# Patient Record
Sex: Female | Born: 1960 | Race: White | Hispanic: No | Marital: Married
Health system: Southern US, Community
[De-identification: ages and names within clinical notes are randomized; demographics above are authoritative.]

---

## 2008-02-20 ENCOUNTER — Encounter: Admission: RE | Admit: 2008-02-20 | Discharge: 2008-02-20 | Payer: Self-pay | Admitting: Internal Medicine

## 2008-12-27 ENCOUNTER — Emergency Department (HOSPITAL_COMMUNITY): Admission: EM | Admit: 2008-12-27 | Discharge: 2008-12-27 | Payer: Self-pay | Admitting: Emergency Medicine

## 2009-02-23 ENCOUNTER — Encounter: Admission: RE | Admit: 2009-02-23 | Discharge: 2009-02-23 | Payer: Self-pay | Admitting: Internal Medicine

## 2010-02-25 ENCOUNTER — Encounter
Admission: RE | Admit: 2010-02-25 | Discharge: 2010-02-25 | Payer: Self-pay | Source: Home / Self Care | Attending: Internal Medicine | Admitting: Internal Medicine

## 2010-03-31 ENCOUNTER — Encounter
Admission: RE | Admit: 2010-03-31 | Discharge: 2010-03-31 | Payer: Self-pay | Source: Home / Self Care | Attending: Internal Medicine | Admitting: Internal Medicine

## 2010-12-12 ENCOUNTER — Other Ambulatory Visit: Payer: Self-pay | Admitting: Internal Medicine

## 2010-12-12 DIAGNOSIS — R102 Pelvic and perineal pain: Secondary | ICD-10-CM

## 2010-12-21 ENCOUNTER — Ambulatory Visit
Admission: RE | Admit: 2010-12-21 | Discharge: 2010-12-21 | Disposition: A | Discharge status: Home / Self Care | Payer: BC Managed Care – PPO | Source: Ambulatory Visit | Attending: Internal Medicine | Admitting: Internal Medicine

## 2010-12-21 DIAGNOSIS — R102 Pelvic and perineal pain: Secondary | ICD-10-CM

## 2011-03-28 ENCOUNTER — Other Ambulatory Visit: Payer: Self-pay | Admitting: Internal Medicine

## 2011-03-28 DIAGNOSIS — Z1231 Encounter for screening mammogram for malignant neoplasm of breast: Secondary | ICD-10-CM

## 2011-04-25 ENCOUNTER — Ambulatory Visit
Admission: RE | Admit: 2011-04-25 | Discharge: 2011-04-25 | Disposition: A | Discharge status: Home / Self Care | Payer: BC Managed Care – PPO | Source: Ambulatory Visit | Attending: Internal Medicine | Admitting: Internal Medicine

## 2011-04-25 DIAGNOSIS — Z1231 Encounter for screening mammogram for malignant neoplasm of breast: Secondary | ICD-10-CM

## 2012-06-05 ENCOUNTER — Other Ambulatory Visit: Payer: Self-pay

## 2012-06-05 DIAGNOSIS — Z1231 Encounter for screening mammogram for malignant neoplasm of breast: Secondary | ICD-10-CM

## 2012-07-16 ENCOUNTER — Ambulatory Visit
Admission: RE | Admit: 2012-07-16 | Discharge: 2012-07-16 | Disposition: A | Discharge status: Home / Self Care | Payer: Self-pay | Source: Ambulatory Visit

## 2012-07-16 DIAGNOSIS — Z1231 Encounter for screening mammogram for malignant neoplasm of breast: Secondary | ICD-10-CM

## 2012-07-17 ENCOUNTER — Other Ambulatory Visit (HOSPITAL_COMMUNITY): Payer: Self-pay | Admitting: Diagnostic Radiology

## 2012-07-17 DIAGNOSIS — R928 Other abnormal and inconclusive findings on diagnostic imaging of breast: Secondary | ICD-10-CM

## 2012-07-24 ENCOUNTER — Other Ambulatory Visit: Payer: Self-pay | Admitting: Internal Medicine

## 2012-07-24 DIAGNOSIS — R928 Other abnormal and inconclusive findings on diagnostic imaging of breast: Secondary | ICD-10-CM

## 2012-07-29 ENCOUNTER — Ambulatory Visit
Admission: RE | Admit: 2012-07-29 | Discharge: 2012-07-29 | Disposition: A | Discharge status: Home / Self Care | Payer: BC Managed Care – PPO | Source: Ambulatory Visit | Attending: Diagnostic Radiology | Admitting: Diagnostic Radiology

## 2012-07-29 DIAGNOSIS — R928 Other abnormal and inconclusive findings on diagnostic imaging of breast: Secondary | ICD-10-CM

## 2013-02-26 IMAGING — US US PELVIS COMPLETE
1 series · 14 of 25 positions shown · non-contrast
Comparison: None.

CLINICAL DATA: Abdominal pain, right lower quadrant pain

TRANSABDOMINAL ULTRASOUND OF PELVIS
TECHNIQUE: Transabdominal ultrasound examination of the pelvis was
performed including evaluation of the uterus, ovaries, adnexal
regions, and pelvic cul-de-sac.

[Series 1: us pelvis complete · 0.22mm/px · 14 of 66 slices shown]
[im 1/66]
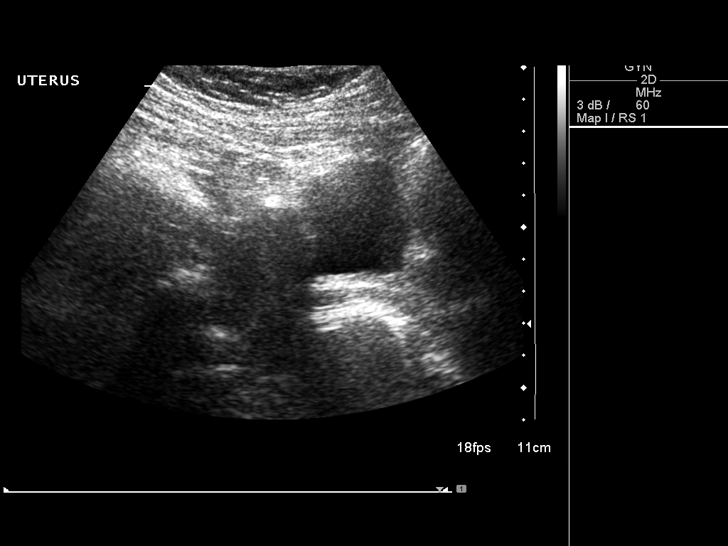
[im 6/66]
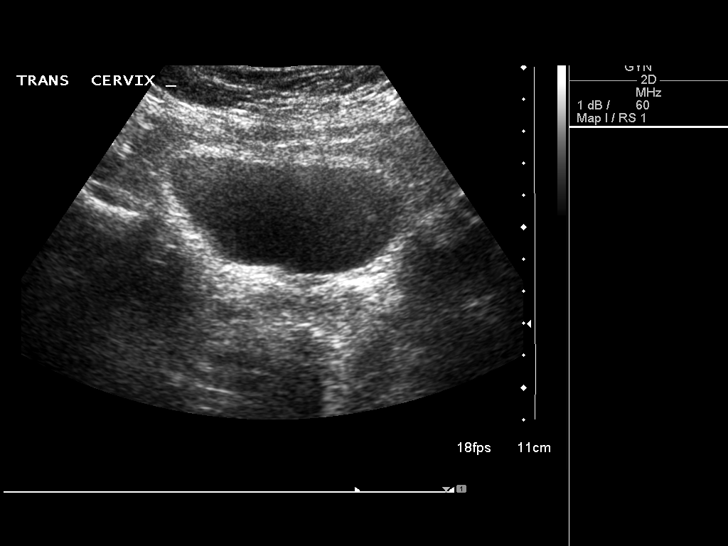
[im 11/66]
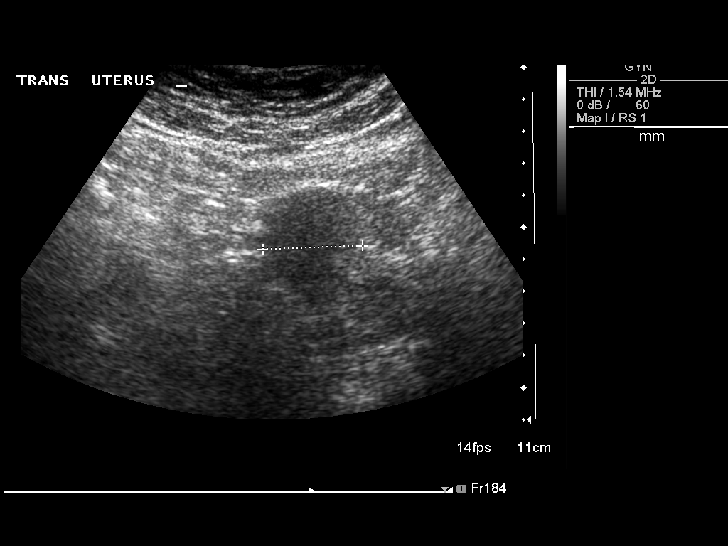
[im 17/66]
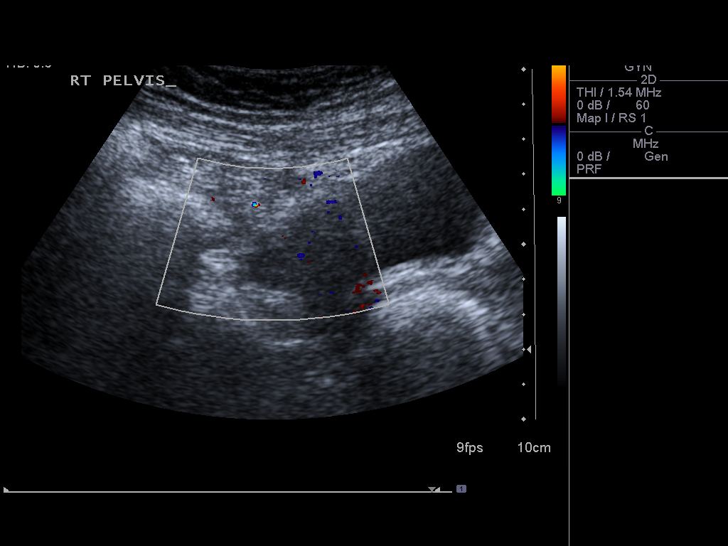
[im 22/66]
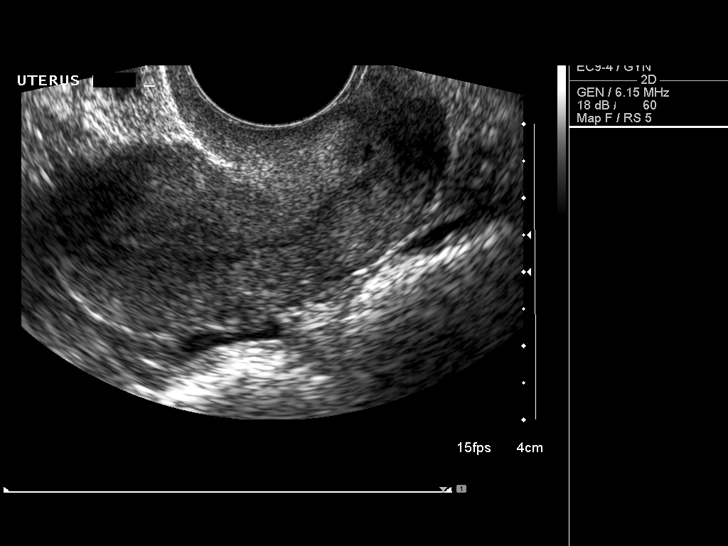
[im 25/66]
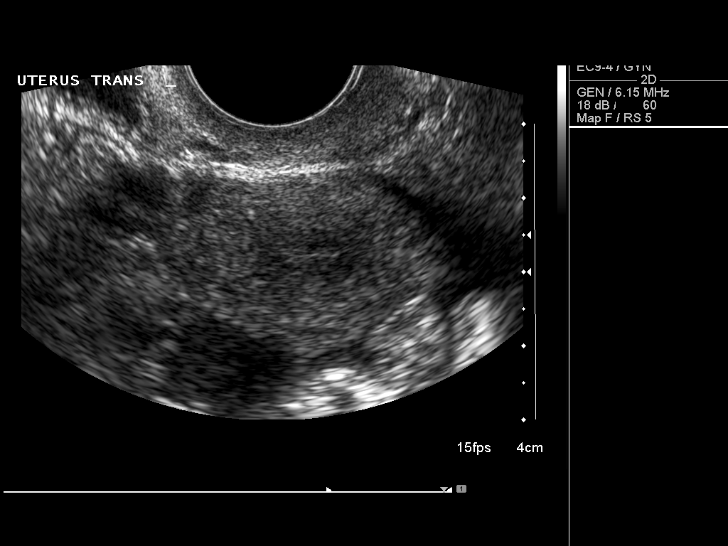
[im 30/66]
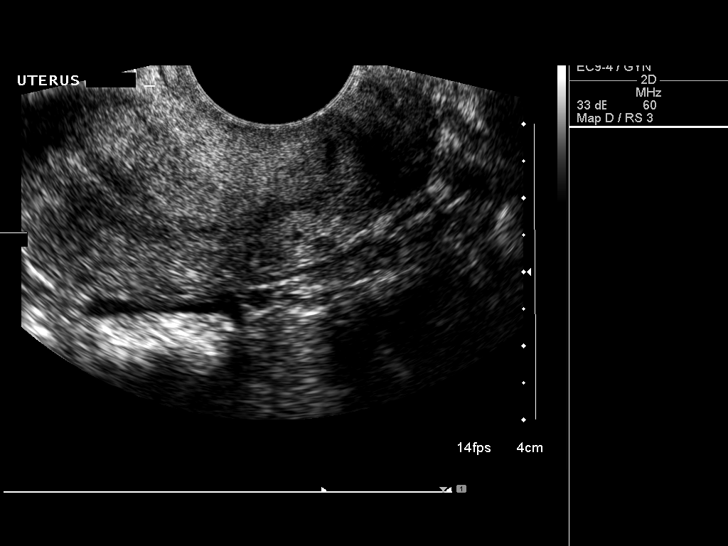
[im 36/66]
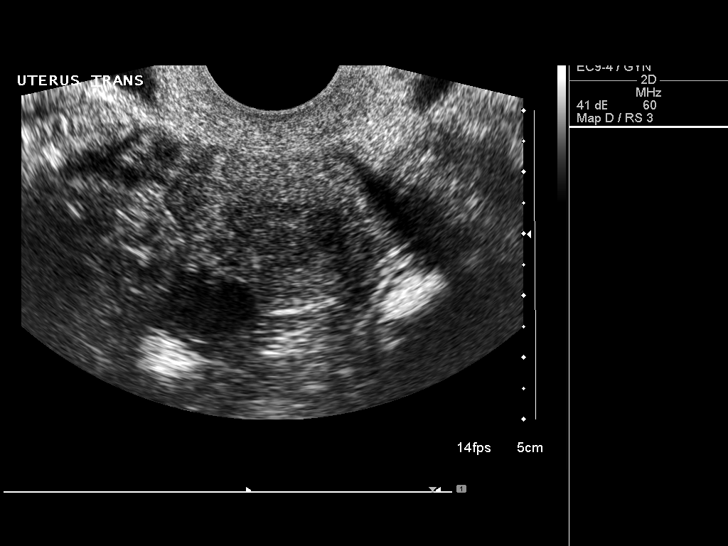
[im 41/66]
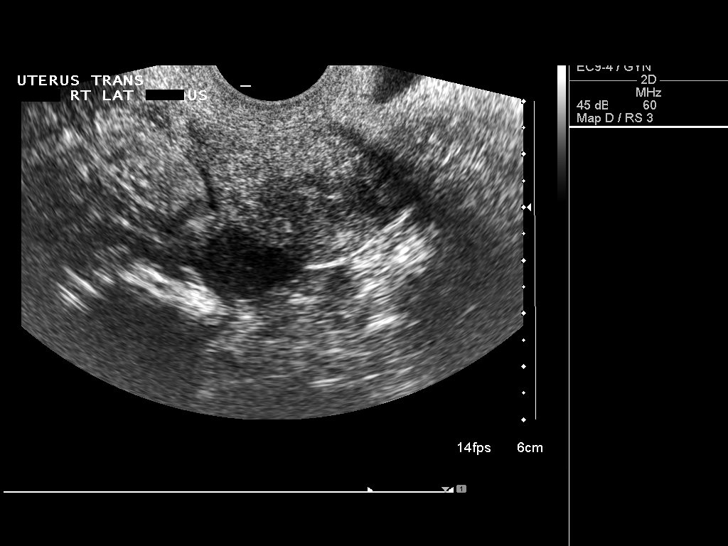
[im 44/66]
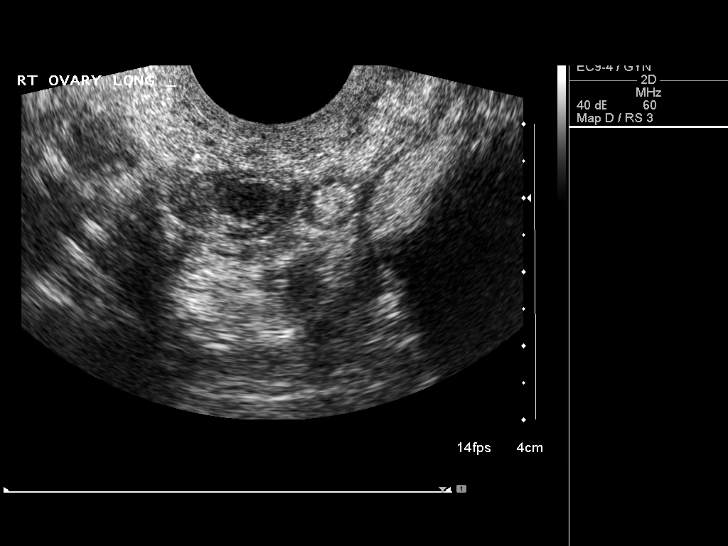
[im 49/66]
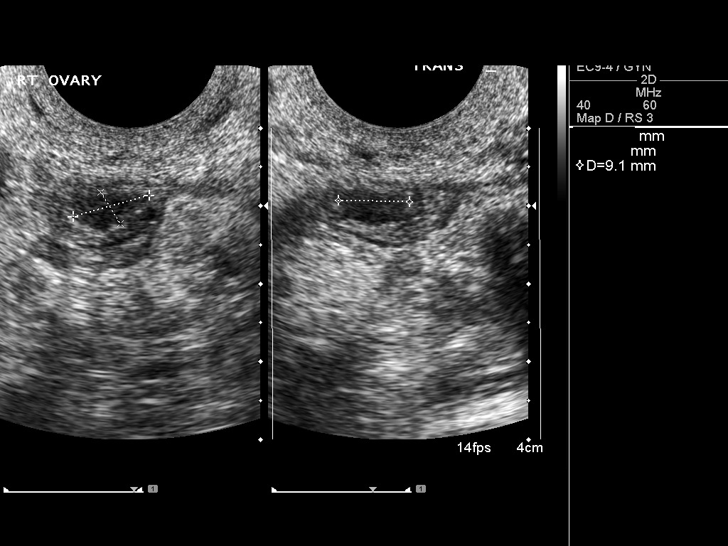
[im 55/66]
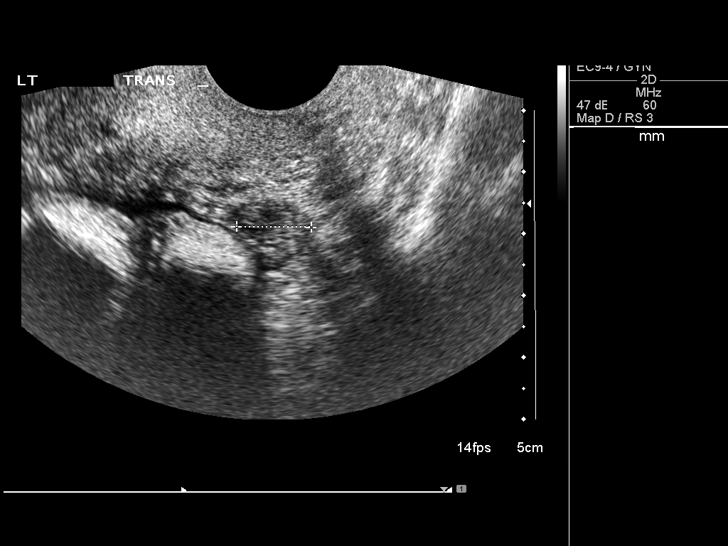
[im 60/66]
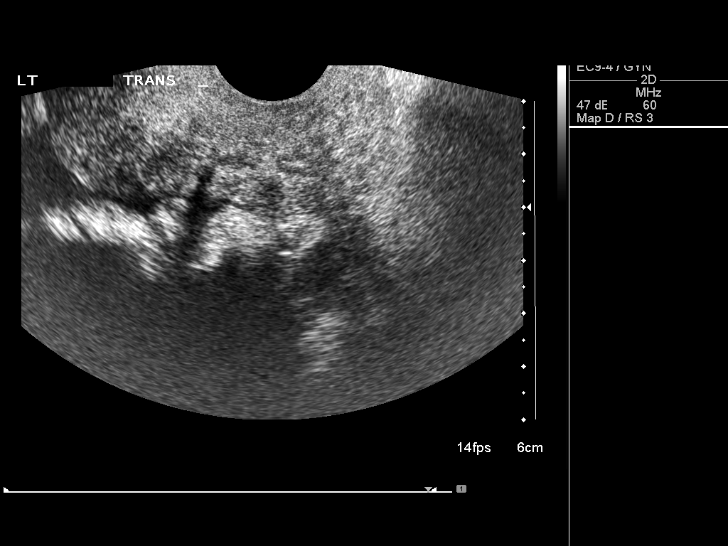
[im 66/66]
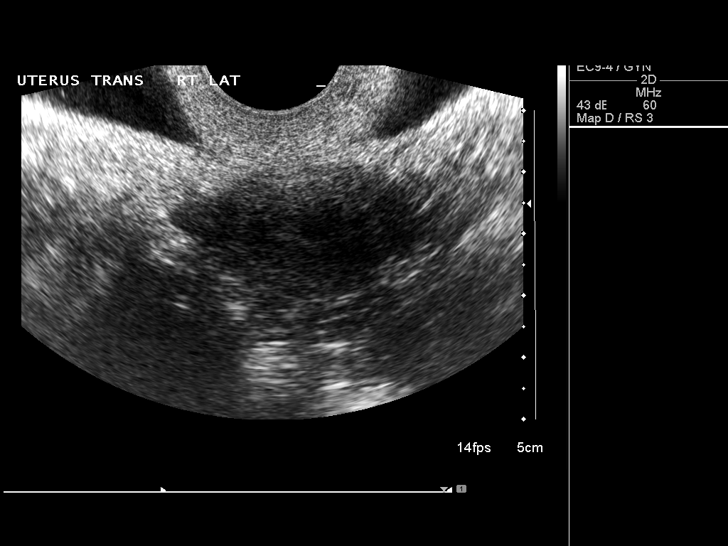

[14 of 25 positions shown; findings below may reference images not displayed]

FINDINGS: Uterus:  Normal in size and echogenicity measuring 6.2 x 2.6 x 3
with 3 cm. There is a  small round hypoechoic lesion along  the
posterior wall measuring 1.8 cm.

Endometrium: Normal in thickness at 2 mm.

Right ovary: Normal in size and echogenicity measuring 1.8 x 1.0 x
1.5 cm.  There is a small 10 mm hypoechoic region which likely
represents a corpus luteal cyst.

Left ovary: Normal size and echogenicity measuring 1.7x 0.8 x
cm

Other Findings:  No gross free fluid.
IMPRESSION: 1.  No acute findings on pelvic ultrasound.

2.   Small intramural versus serosal leiomyoma.

## 2013-02-26 IMAGING — US US ABDOMEN COMPLETE
1 series · 14 of 25 positions shown · non-contrast
Comparison: None.

CLINICAL DATA: Right lower quadrant abdominal pain and pelvic pain

COMPLETE ABDOMINAL ULTRASOUND

[Series 1: us abdomen complete · 0.20mm/px · 14 of 82 slices shown]
[im 1/82]
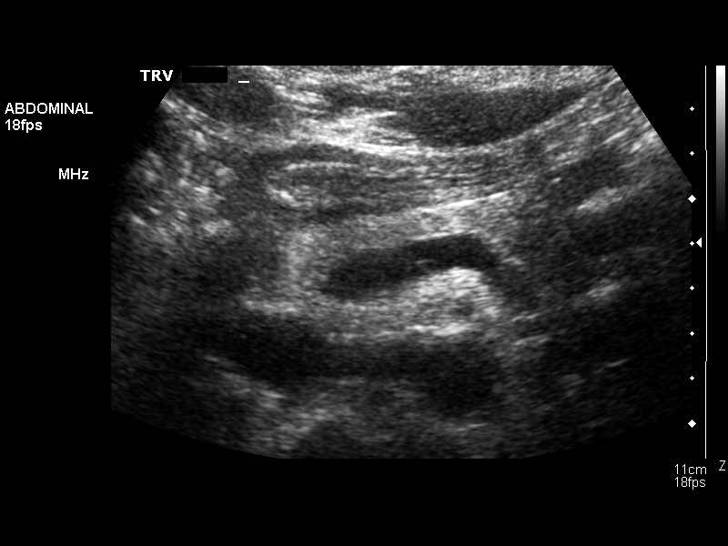
[im 7/82]
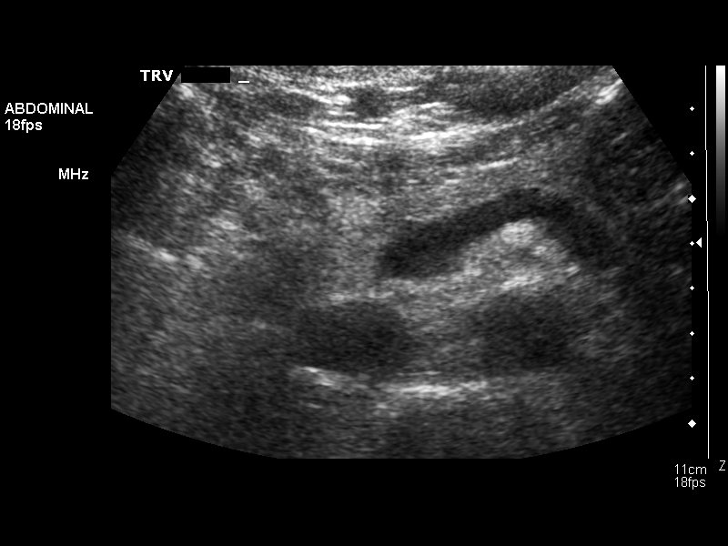
[im 14/82]
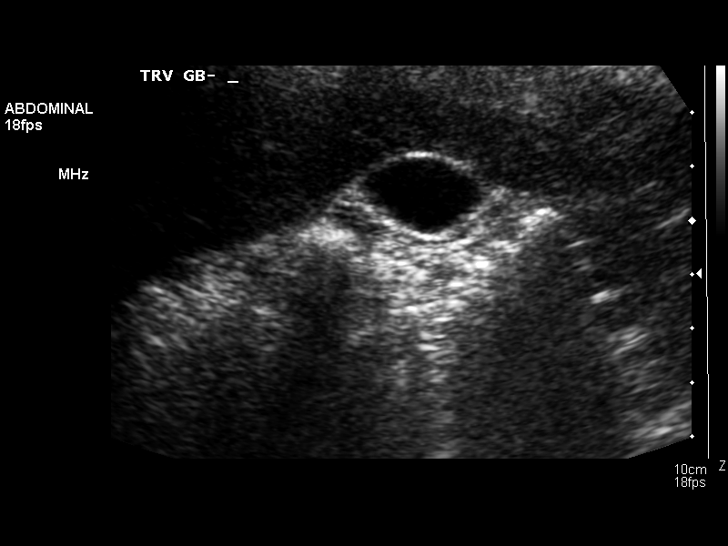
[im 21/82]
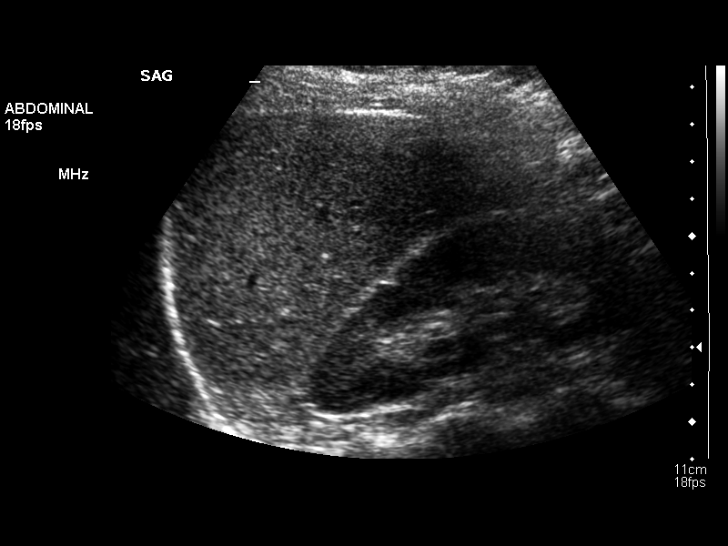
[im 28/82]
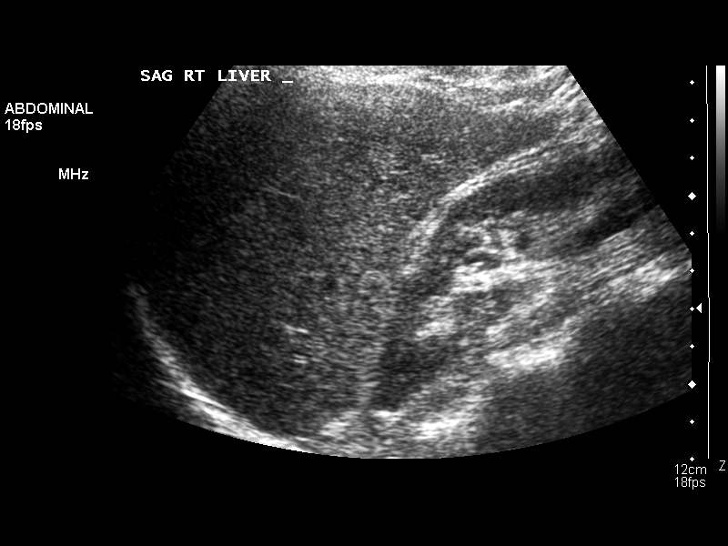
[im 31/82]
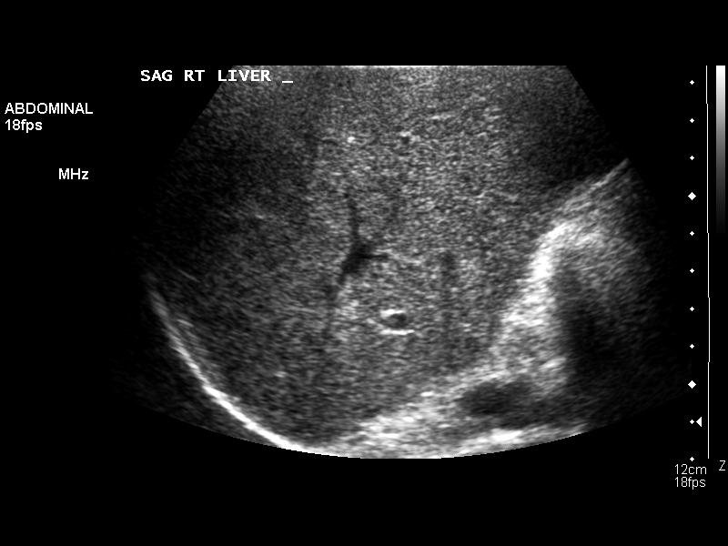
[im 38/82]
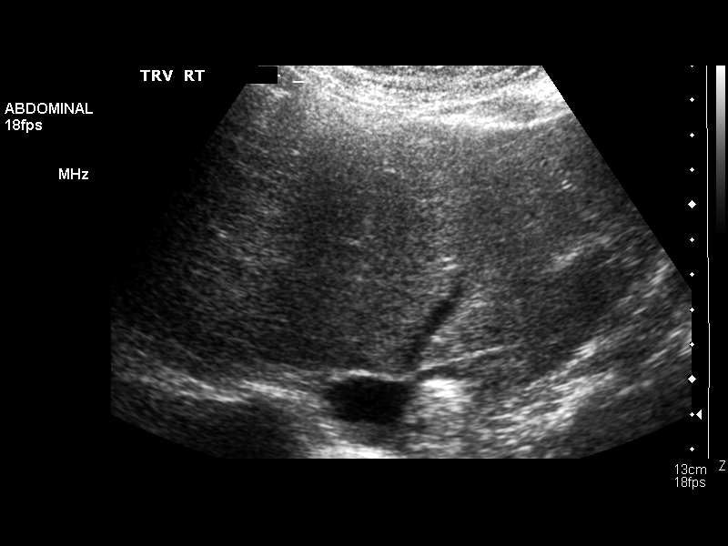
[im 44/82]
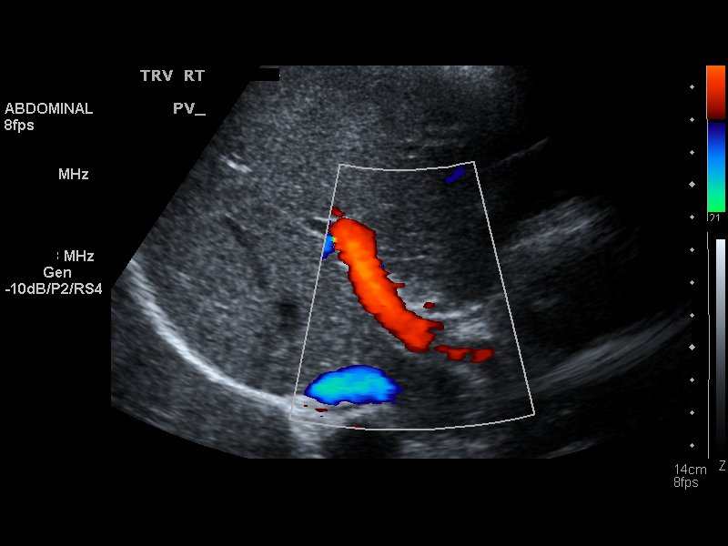
[im 51/82]
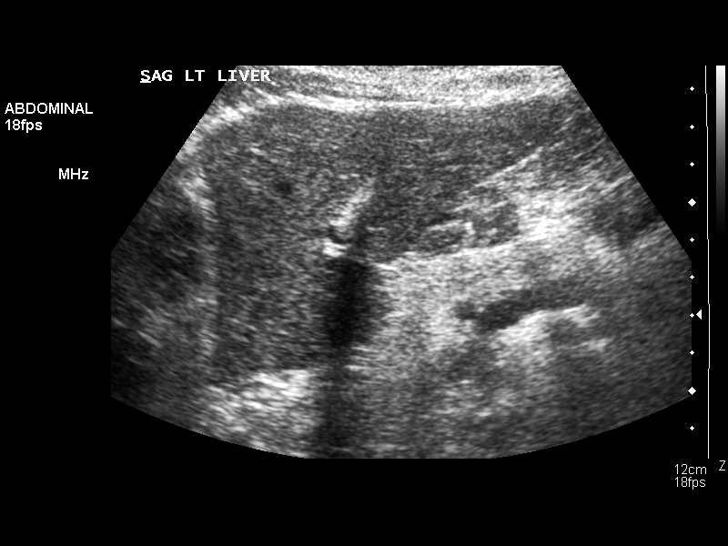
[im 55/82]
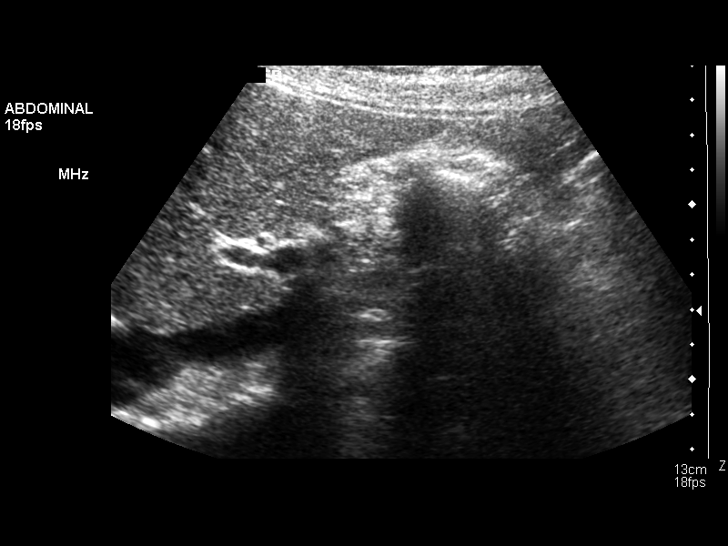
[im 61/82]
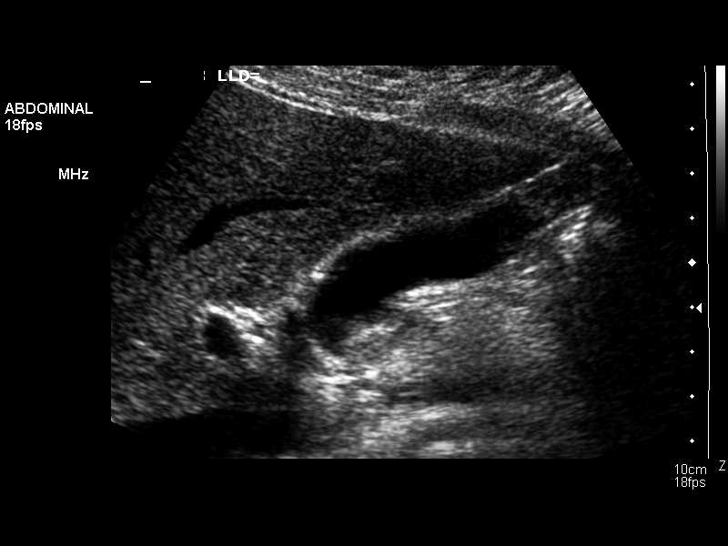
[im 68/82]
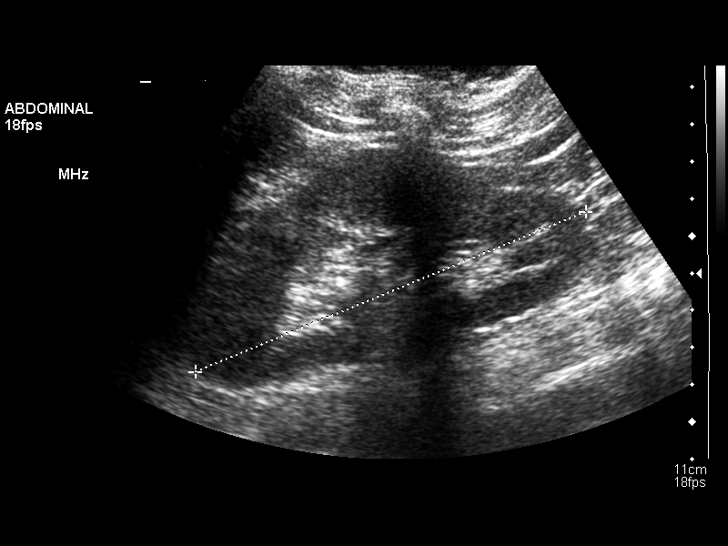
[im 75/82]
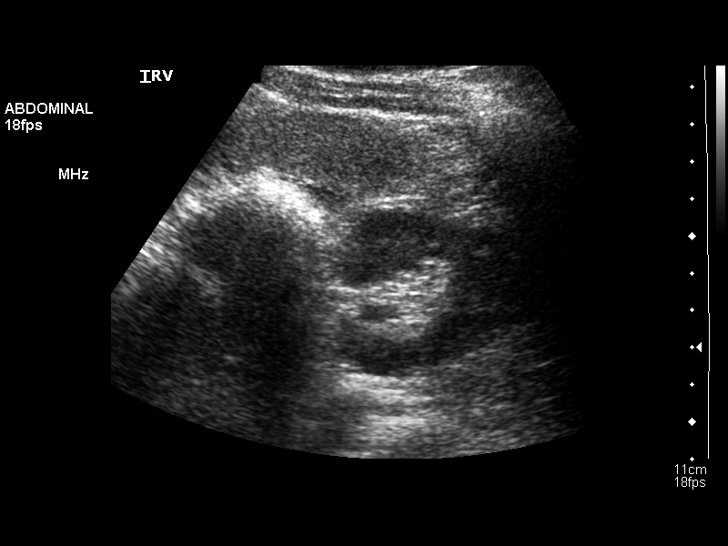
[im 82/82]
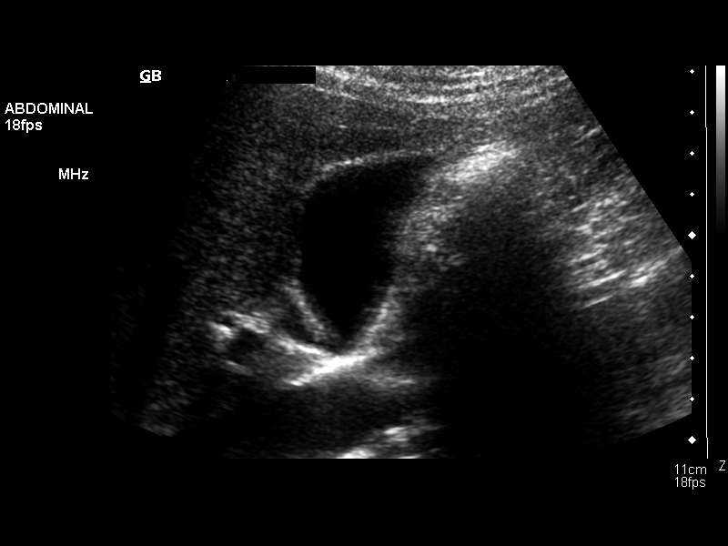

[14 of 25 positions shown; findings below may reference images not displayed]

FINDINGS: Gallbladder:  The gallbladder is visualized and no gallstones are
noted.  There is no pain over gallbladder with compression.

Common bile duct:  The common bile duct is normal measuring 3.4 mm
in diameter proximally.

Liver:  The liver is slightly inhomogeneous.  Mild fatty
infiltration is a consideration.  No focal abnormality is seen.

IVC:  The IVC is largely obscured by bowel gas.

Pancreas:  The pancreas is normal in size.  The pancreatic duct is
not dilated.

Spleen:  The spleen is normal measuring 9.5 cm sagittally.

Right Kidney:  No hydronephrosis is seen.  The right kidney
measures 11.4 cm sagittally.

Left Kidney:  No hydronephrosis.  The left kidney measures 11.5 cm.

Abdominal aorta:  The abdominal aorta is normal in caliber.
IMPRESSION: 1.  No gallstones.  No ductal dilatation.
2.  Slightly inhomogeneous liver may indicate mild fatty
infiltration.

## 2013-06-18 ENCOUNTER — Other Ambulatory Visit: Payer: Self-pay | Admitting: Internal Medicine

## 2013-06-18 DIAGNOSIS — M79673 Pain in unspecified foot: Secondary | ICD-10-CM

## 2013-06-20 ENCOUNTER — Ambulatory Visit
Admission: RE | Admit: 2013-06-20 | Discharge: 2013-06-20 | Disposition: A | Discharge status: Home / Self Care | Payer: BC Managed Care – PPO | Source: Ambulatory Visit | Attending: Internal Medicine | Admitting: Internal Medicine

## 2013-06-20 DIAGNOSIS — M79673 Pain in unspecified foot: Secondary | ICD-10-CM

## 2013-07-24 ENCOUNTER — Other Ambulatory Visit: Payer: Self-pay

## 2013-07-24 DIAGNOSIS — Z1231 Encounter for screening mammogram for malignant neoplasm of breast: Secondary | ICD-10-CM

## 2013-08-04 ENCOUNTER — Encounter (INDEPENDENT_AMBULATORY_CARE_PROVIDER_SITE_OTHER): Payer: Self-pay

## 2013-08-04 ENCOUNTER — Ambulatory Visit
Admission: RE | Admit: 2013-08-04 | Discharge: 2013-08-04 | Disposition: A | Discharge status: Home / Self Care | Payer: BC Managed Care – PPO | Source: Ambulatory Visit

## 2013-08-04 DIAGNOSIS — Z1231 Encounter for screening mammogram for malignant neoplasm of breast: Secondary | ICD-10-CM

## 2015-08-27 IMAGING — US US EXTREM LOW VENOUS*L*
1 series · 14 of 24 positions shown · non-contrast
Comparison: None.

CLINICAL DATA: Foot pain.

EXAM:
LEFT LOWER EXTREMITY VENOUS DOPPLER ULTRASOUND
TECHNIQUE: Gray-scale sonography with graded compression, as well as color
Doppler and duplex ultrasound, were performed to evaluate the deep
venous system from the level of the common femoral vein through the
popliteal and proximal calf veins. Spectral Doppler was utilized to
evaluate flow at rest and with distal augmentation maneuvers.

[Series 1: us extrem low venous*left* · 14 of 28 slices shown]
[im 1/28]
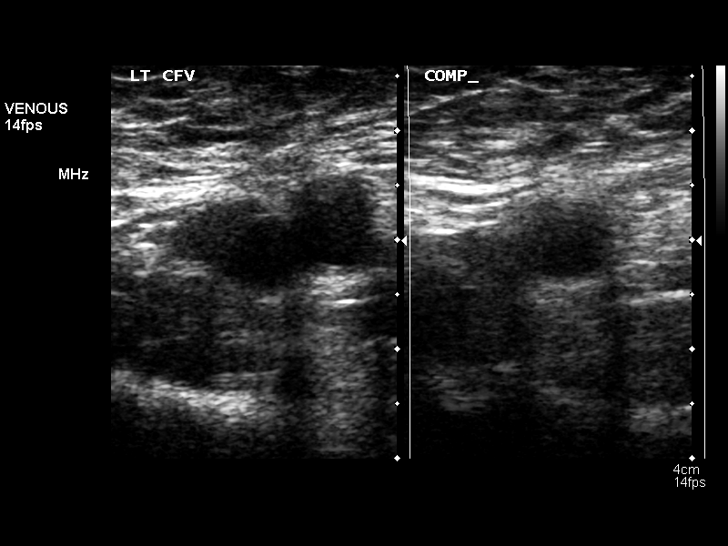
[im 3/28]
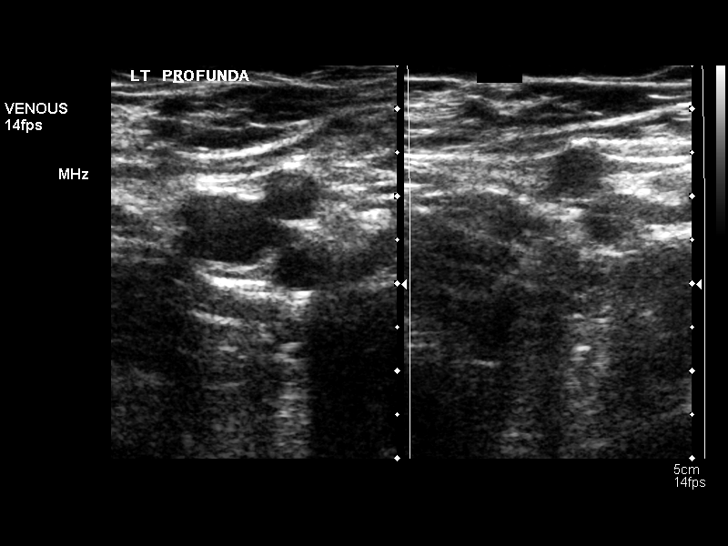
[im 5/28]
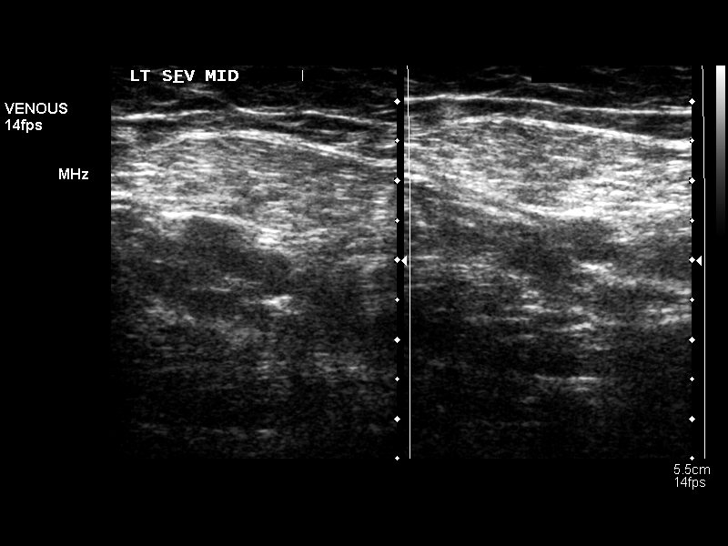
[im 8/28]
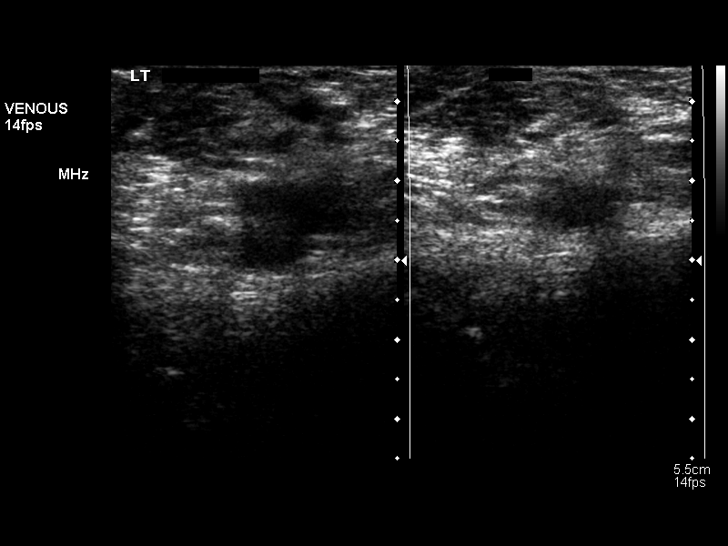
[im 9/28]
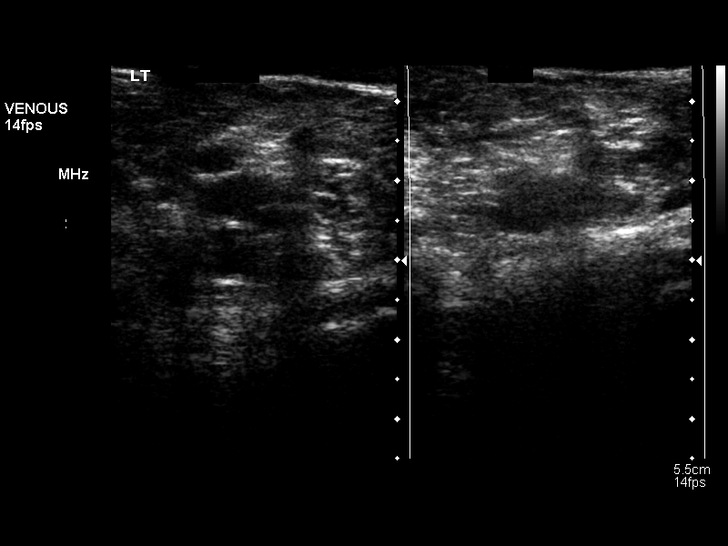
[im 11/28]
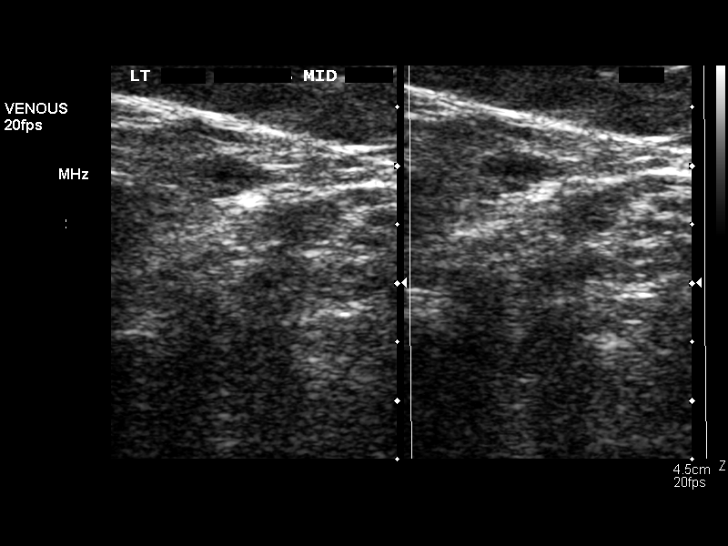
[im 13/28]
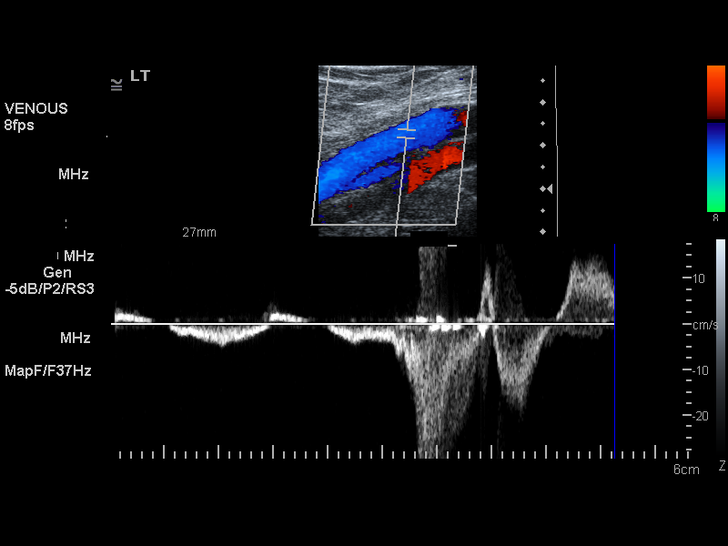
[im 15/28]
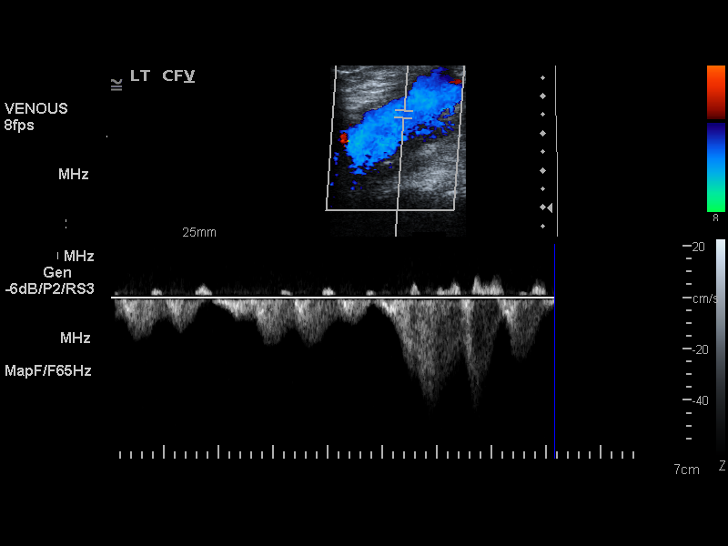
[im 17/28]
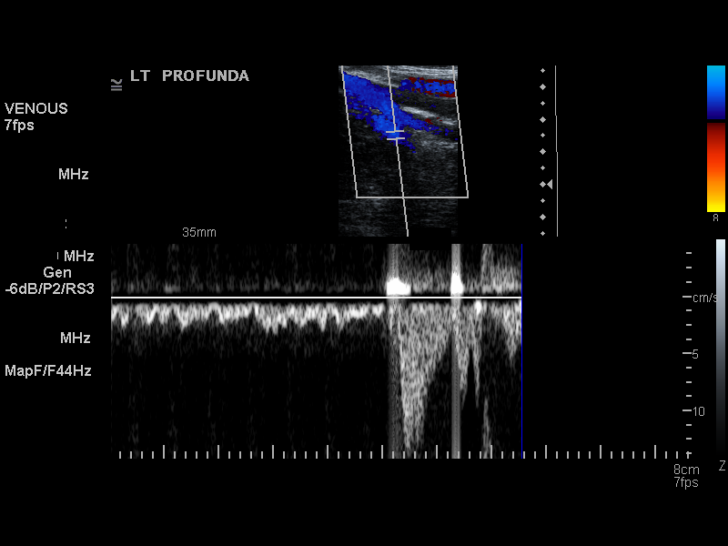
[im 19/28]
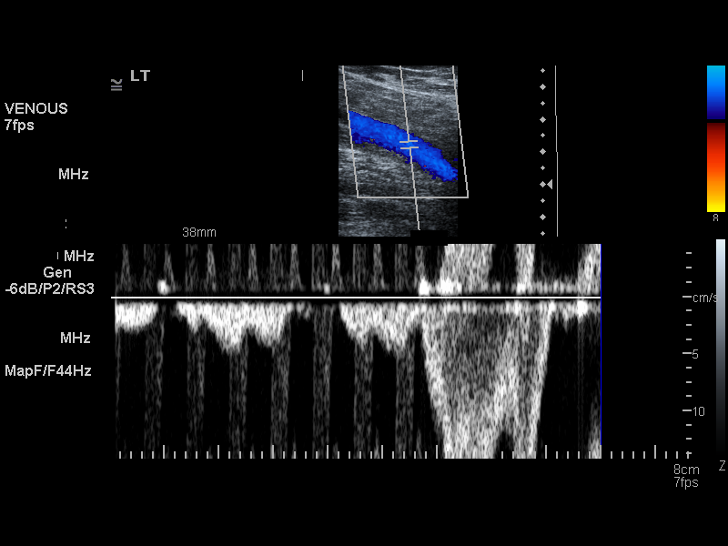
[im 22/28]
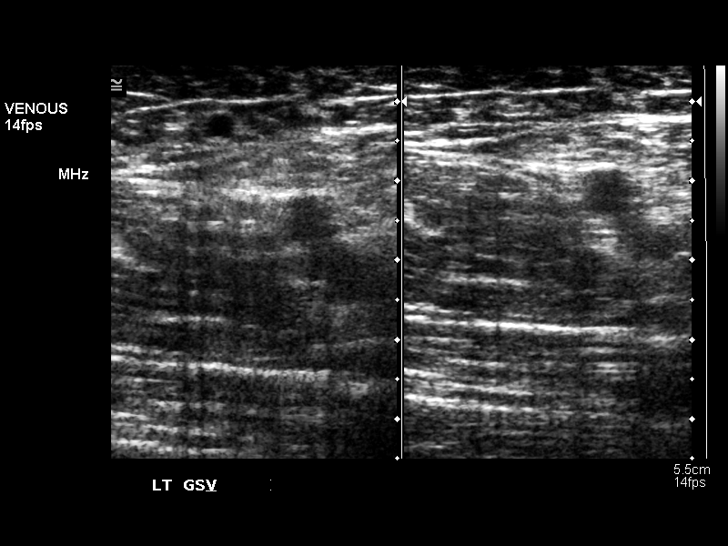
[im 23/28]
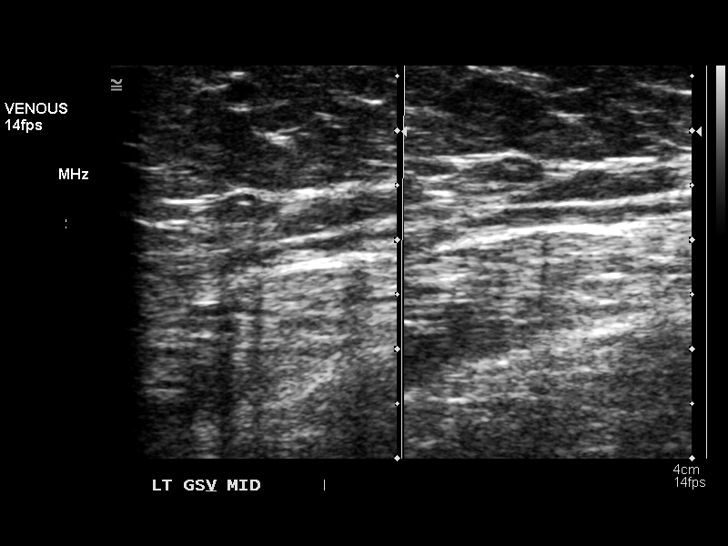
[im 25/28]
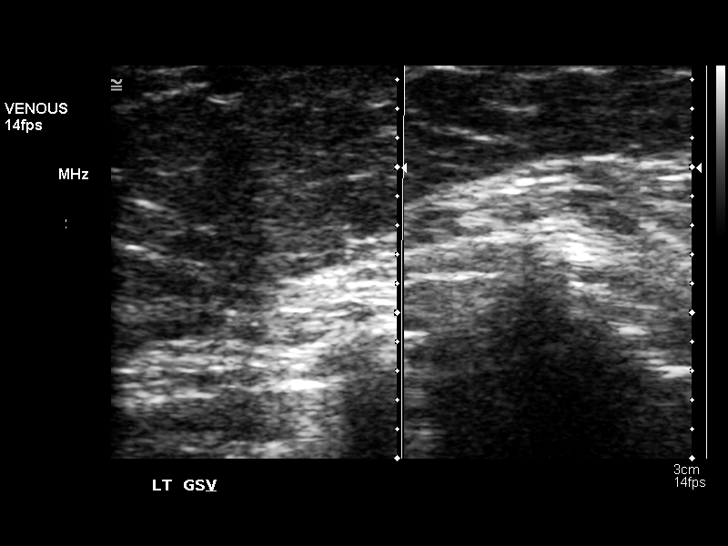
[im 28/28]
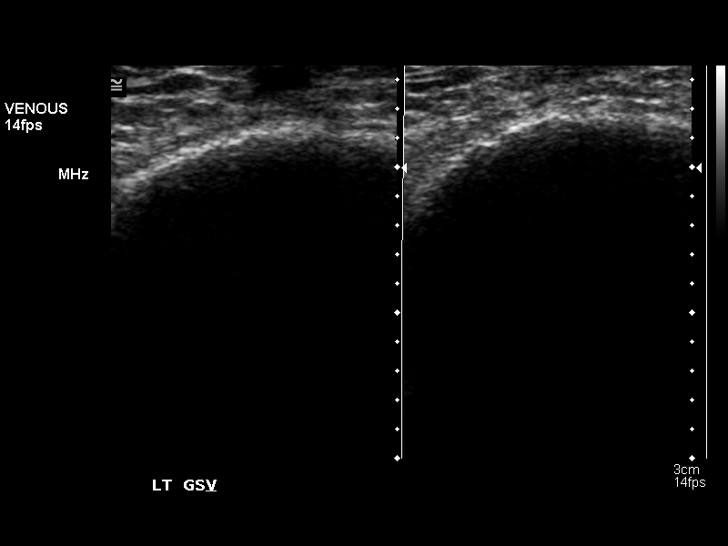

[14 of 24 positions shown; findings below may reference images not displayed]

FINDINGS: Normal compressibility, augmentation and color Doppler flow in the
left common femoral vein, left femoral vein and left popliteal vein.
The left great saphenous vein is patent. Visualized deep calf veins
are patent.
IMPRESSION: Negative for left lower extremity DVT.
